# Patient Record
Sex: Female | Born: 1995 | Race: White | Hispanic: No | Smoking: Current every day smoker
Health system: Southern US, Community
[De-identification: ages and names within clinical notes are randomized; demographics above are authoritative.]

## PROBLEM LIST (undated history)

## (undated) DIAGNOSIS — T7840XA Allergy, unspecified, initial encounter: Secondary | ICD-10-CM

## (undated) DIAGNOSIS — Z720 Tobacco use: Secondary | ICD-10-CM

## (undated) HISTORY — PX: INDUCED ABORTION: SHX677

## (undated) HISTORY — DX: Allergy, unspecified, initial encounter: T78.40XA

## (undated) HISTORY — DX: Tobacco use: Z72.0

---
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
## 2016-01-05 ENCOUNTER — Ambulatory Visit
Admission: RE | Admit: 2016-01-05 | Discharge: 2016-01-05 | Disposition: A | Discharge status: Home / Self Care | Payer: BLUE CROSS/BLUE SHIELD | Source: Ambulatory Visit | Attending: Family Medicine | Admitting: Family Medicine

## 2016-01-05 DIAGNOSIS — R102 Pelvic and perineal pain: Secondary | ICD-10-CM | POA: Insufficient documentation

## 2016-01-05 DIAGNOSIS — Z30431 Encounter for routine checking of intrauterine contraceptive device: Secondary | ICD-10-CM | POA: Insufficient documentation

## 2016-02-04 ENCOUNTER — Other Ambulatory Visit: Payer: Self-pay | Admitting: Family Medicine

## 2016-02-04 DIAGNOSIS — R102 Pelvic and perineal pain: Secondary | ICD-10-CM

## 2016-02-04 MED ORDER — IBUPROFEN 800 MG PO TABS: 800.0000 mg | ORAL_TABLET | Freq: Three times a day (TID) | ORAL | Status: DC | PRN

## 2016-02-04 NOTE — Progress Notes (Signed)
Mother in office today for her own appointment requested refill for her daughter.

## 2016-03-09 ENCOUNTER — Ambulatory Visit (INDEPENDENT_AMBULATORY_CARE_PROVIDER_SITE_OTHER): Payer: BLUE CROSS/BLUE SHIELD | Admitting: Family Medicine

## 2016-03-09 ENCOUNTER — Encounter: Payer: Self-pay | Admitting: Family Medicine

## 2016-03-09 VITALS — BP 118/76 | HR 99 | Temp 98.1°F | Resp 17 | Ht 59.0 in | Wt 143.4 lb

## 2016-03-09 DIAGNOSIS — R059 Cough, unspecified: Secondary | ICD-10-CM

## 2016-03-09 DIAGNOSIS — J0100 Acute maxillary sinusitis, unspecified: Secondary | ICD-10-CM

## 2016-03-09 DIAGNOSIS — J302 Other seasonal allergic rhinitis: Secondary | ICD-10-CM | POA: Diagnosis not present

## 2016-03-09 DIAGNOSIS — R05 Cough: Secondary | ICD-10-CM | POA: Diagnosis not present

## 2016-03-09 MED ORDER — FEXOFENADINE HCL 180 MG PO TABS: 180.0000 mg | ORAL_TABLET | Freq: Every day | ORAL | Status: DC

## 2016-03-09 MED ORDER — AZITHROMYCIN 250 MG PO TABS: ORAL_TABLET | ORAL | Status: DC

## 2016-03-09 MED ORDER — BENZONATATE 200 MG PO CAPS: 200.0000 mg | ORAL_CAPSULE | Freq: Three times a day (TID) | ORAL | Status: DC | PRN

## 2016-03-09 NOTE — Progress Notes (Signed)
Name: Veronica Herman   MRN: [id number]    DOB: [birth year]   Date:03/09/2016       Progress Note  Subjective  Chief Complaint  Chief Complaint  Patient presents with  . Acute Visit    Sore throat / cough x1 wk    HPI  Pt. Presents for evaluation of 7 day history of sore throat, dry cough and ears feeling clogged up. She is also requesting a separate medication for allergies, she was prescribed levocetirizine by previous PCP, which is not covered   Past Medical History  Diagnosis Date  . Allergy     Past Surgical History  Procedure Laterality Date  . Induced abortion      almost 2 yrs ago    Family History  Problem Relation Age of Onset  . Heart murmur Mother   . Hyperlipidemia Mother   . Hypertension Mother   . Allergic Disorder Father   . Hyperlipidemia Father   . Hypertension Father   . Drug abuse Maternal Aunt   . Hyperlipidemia Maternal Aunt   . Hypertension Maternal Aunt   . Mental illness Maternal Aunt   . Drug abuse Maternal Uncle   . Hypertension Paternal Uncle   . Arthritis Maternal Grandmother   . Asthma Maternal Grandmother   . Cancer Maternal Grandmother   . Depression Maternal Grandmother   . Diabetes Maternal Grandmother   . Hyperlipidemia Maternal Grandmother   . Hypertension Maternal Grandmother   . Mental illness Maternal Grandmother   . Alcohol abuse Maternal Grandfather   . Arthritis Maternal Grandfather   . Asthma Maternal Grandfather   . Cancer Maternal Grandfather   . Diabetes Maternal Grandfather   . Hypertension Maternal Grandfather   . Arthritis Paternal Grandmother   . Asthma Paternal Grandmother   . Cancer Paternal Grandmother   . Diabetes Paternal Grandmother   . Hyperlipidemia Paternal Grandmother   . Hypertension Paternal Grandmother   . Alcohol abuse Paternal Grandfather   . Arthritis Paternal Grandfather   . Asthma Paternal Grandfather   . Cancer Paternal Grandfather   . Diabetes Paternal Grandfather   . Hypertension  Paternal Grandfather     Social History   Social History  . Marital Status: Single    Spouse Name: N/A  . Number of Children: N/A  . Years of Education: N/A   Occupational History  . Not on file.   Social History Main Topics  . Smoking status: Never Smoker   . Smokeless tobacco: Not on file  . Alcohol Use: No  . Drug Use: No  . Sexual Activity:    Partners: Male    Birth Control/ Protection: Condom, IUD   Other Topics Concern  . Not on file   Social History Narrative     Current outpatient prescriptions:  .  ibuprofen (ADVIL,MOTRIN) 800 MG tablet, Take 1 tablet (800 mg total) by mouth every 8 (eight) hours as needed., Disp: 40 tablet, Rfl: 3  No Known Allergies   Review of Systems  Constitutional: Positive for fever and chills.  HENT: Positive for sore throat.   Respiratory: Positive for cough. Negative for sputum production.     Objective  Filed Vitals:   03/09/16 0826  BP: 118/76  Pulse: 99  Temp: 98.1 F (36.7 C)  TempSrc: Oral  Resp: 17  Height:  (1.499 m)  Weight: 143 lb 6.4 oz (65.046 kg)  SpO2: 96%    Physical Exam  Constitutional: She is well-developed, well-nourished, and in no distress.  HENT:  Head: Normocephalic and atraumatic.  Right Ear: Tympanic membrane and ear canal normal.  Nose: Right sinus exhibits maxillary sinus tenderness. Left sinus exhibits maxillary sinus tenderness.  Mouth/Throat: Posterior oropharyngeal erythema present.  L ear unable to visualize TM 2/2 narrow canal.  Cardiovascular: Normal rate and regular rhythm.   Pulmonary/Chest: Breath sounds normal.  Nursing note and vitals reviewed.     Assessment & Plan  1. Acute maxillary sinusitis, recurrence not specified  - azithromycin (ZITHROMAX) 250 MG tablet; 2 tabs po day 1, then 1 tab po q day x 4 days.  Dispense: 6 tablet; Refill: 0  2. Cough Likely from sinusitis versus viral URI, will start on antitussive therapy. - benzonatate (TESSALON) 200 MG  capsule; Take 1 capsule (200 mg total) by mouth 3 (three) times daily as needed for cough.  Dispense: 20 capsule; Refill: 0  3. Seasonal allergies  - fexofenadine (ALLEGRA) 180 MG tablet; Take 1 tablet (180 mg total) by mouth daily.  Dispense: 30 tablet; Refill: 0   Yatzary Merriweather Asad A. Faylene KurtzShah [facility] 03/09/2016 8:33 AM

## 2016-03-19 ENCOUNTER — Ambulatory Visit: Payer: BLUE CROSS/BLUE SHIELD | Admitting: Internal Medicine

## 2016-07-17 IMAGING — US US PELVIS COMPLETE
1 series · 14 of 25 positions shown · non-contrast
Comparison: None

CLINICAL DATA: Pelvic pain and IUD check



[Series 1: us pelvis complete · 0.25mm/px · 14 of 90 slices shown]
[im 1/90]
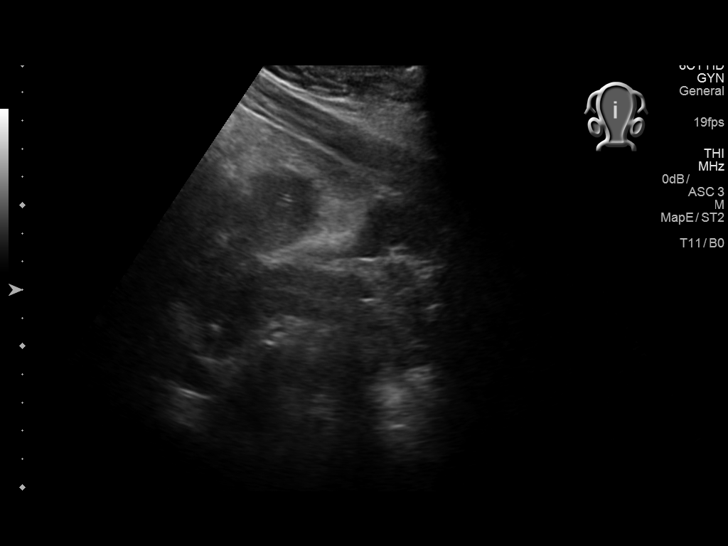
[im 8/90]
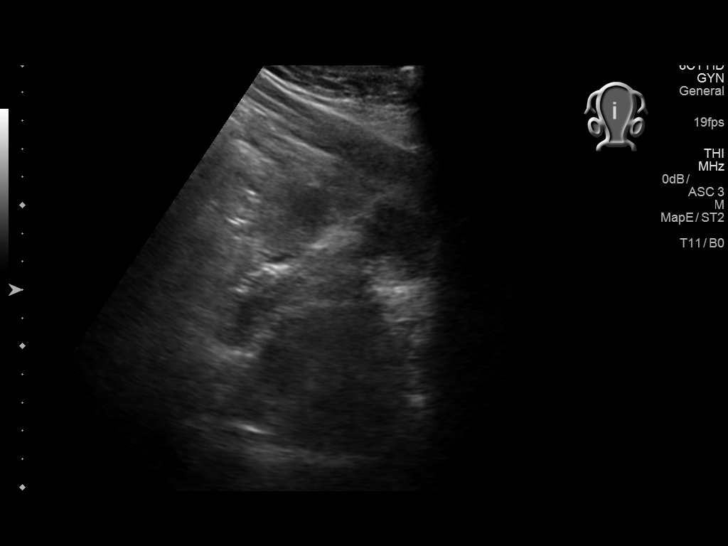
[im 15/90]
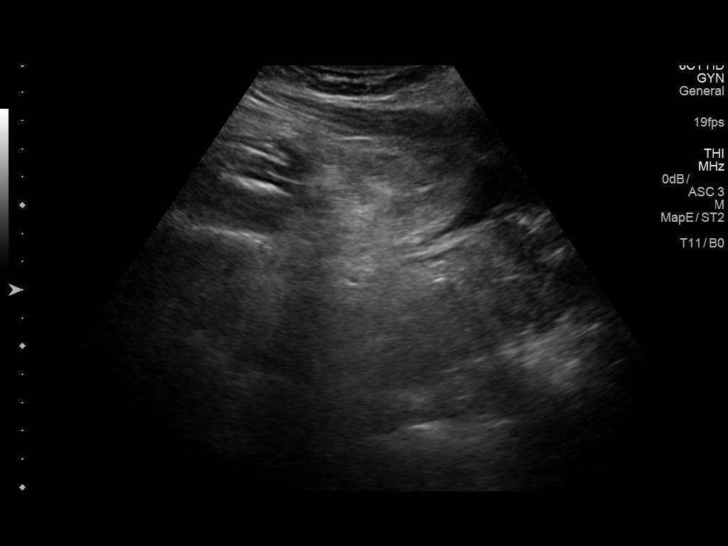
[im 23/90]
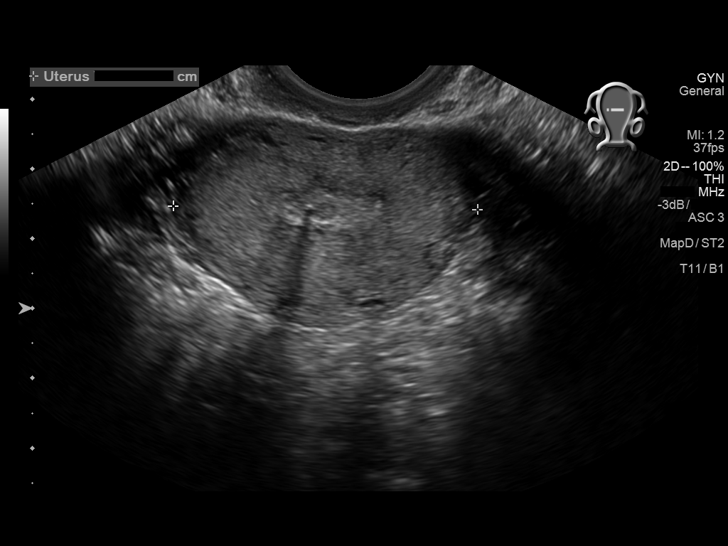
[im 30/90]
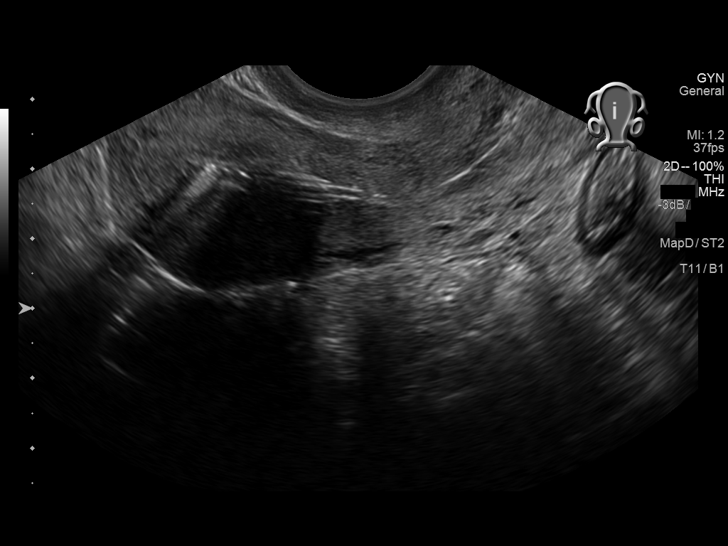
[im 34/90]
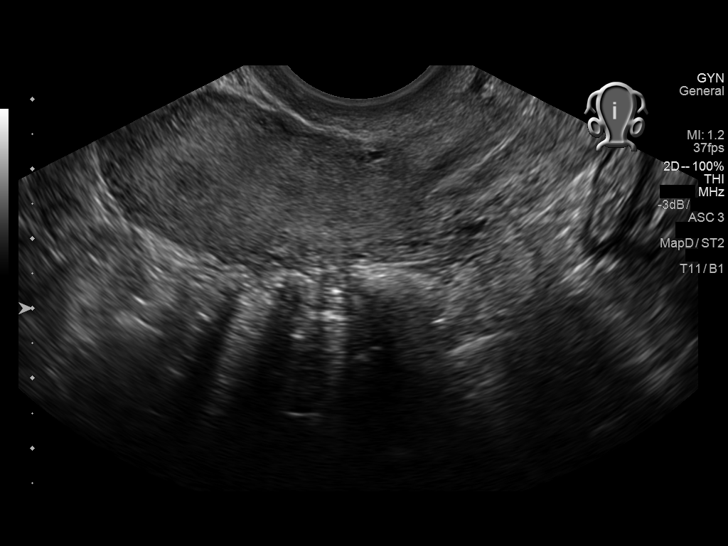
[im 41/90]
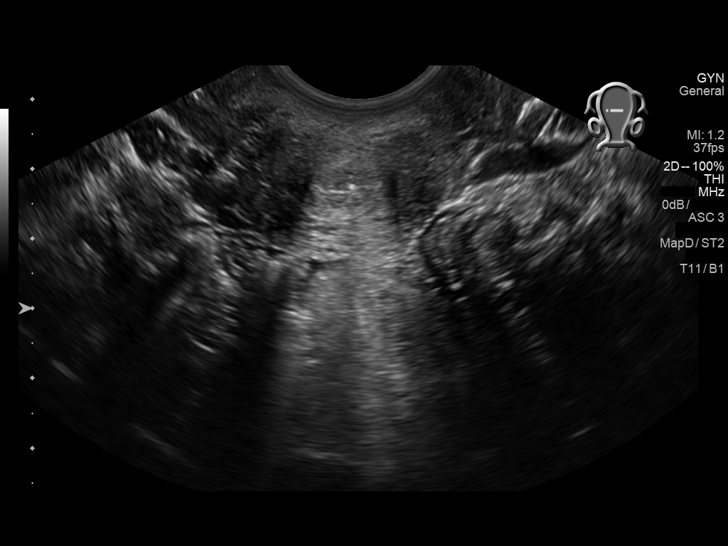
[im 49/90]
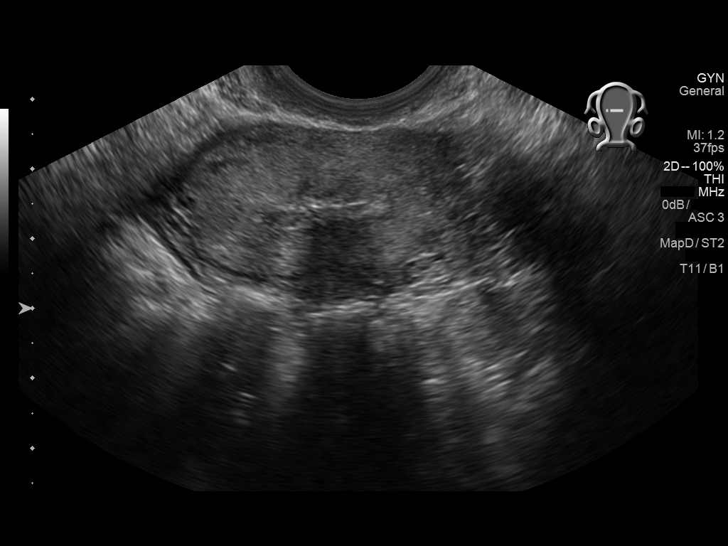
[im 56/90]
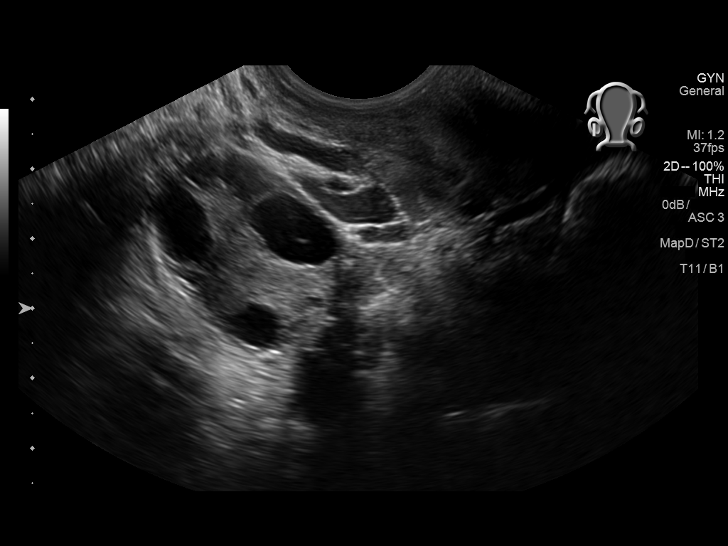
[im 60/90]
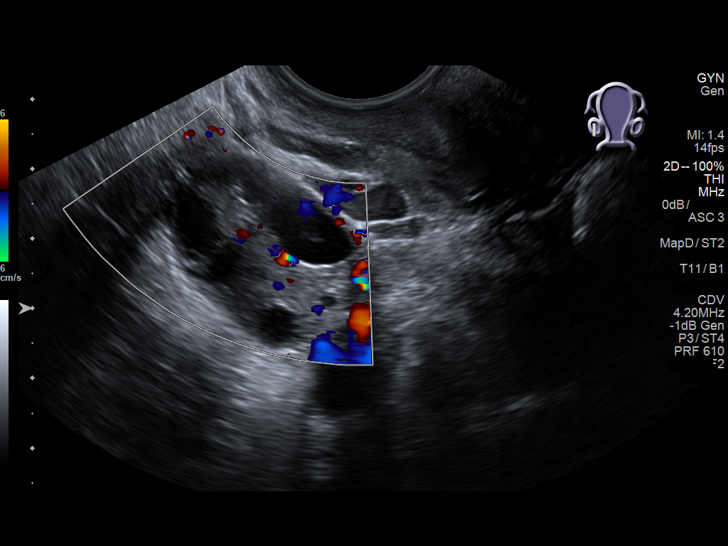
[im 67/90]
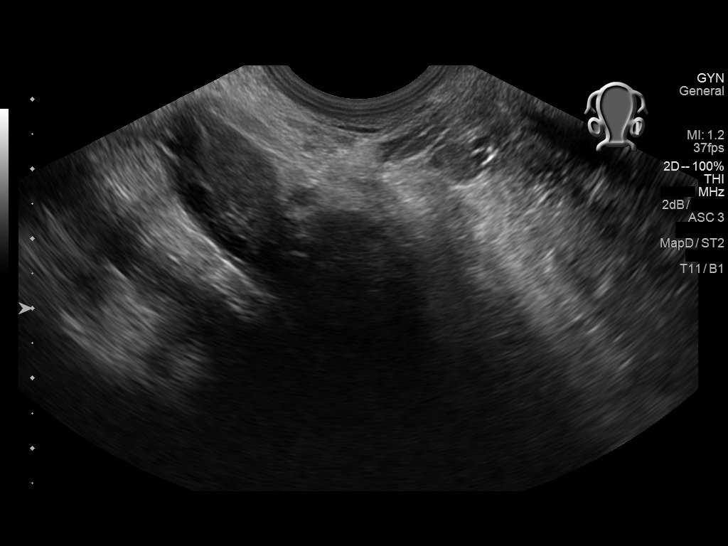
[im 75/90]
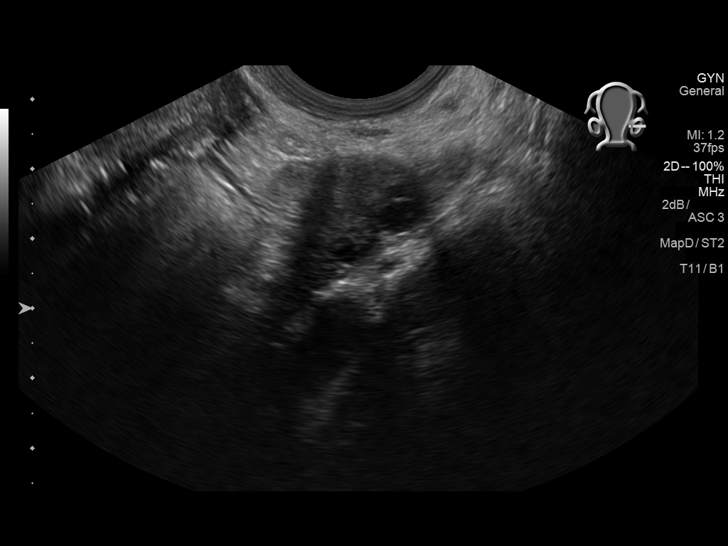
[im 82/90]
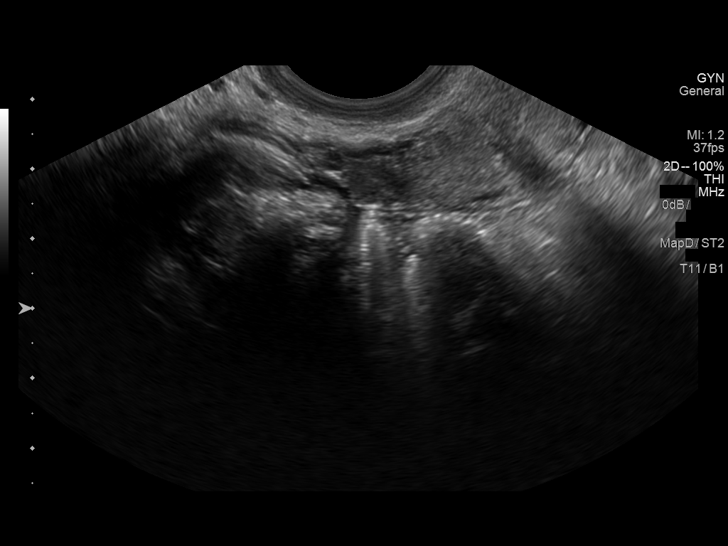
[im 90/90]
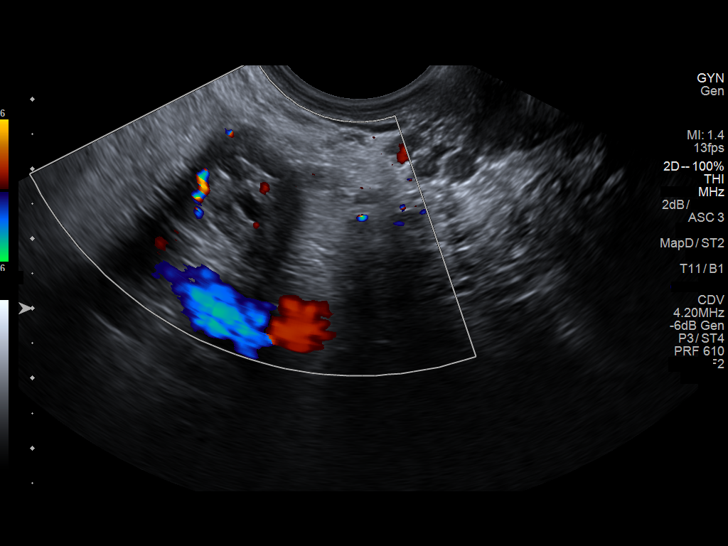

[14 of 25 positions shown; findings below may reference images not displayed]

FINDINGS: Uterus

Measurements: 6.6 x 2.7 x 4.4 cm.. No fibroids or other mass
visualized.

Endometrium

Thickness: 4.9 mm. An IUD is noted in place.. No focal abnormality
visualized.

Right ovary

Measurements: 3.1 x 2.3 x 2.3 cm.. Normal appearance/no adnexal
mass.

Left ovary

Measurements: 3.0 x 1.5 x 2.5 cm.. Normal appearance/no adnexal
mass.

Other findings

No abnormal free fluid.
IMPRESSION: IUD in place.  No acute abnormality noted.

## 2016-07-30 ENCOUNTER — Encounter: Payer: Self-pay | Admitting: Family Medicine

## 2016-07-30 ENCOUNTER — Ambulatory Visit (INDEPENDENT_AMBULATORY_CARE_PROVIDER_SITE_OTHER): Payer: BLUE CROSS/BLUE SHIELD | Admitting: Family Medicine

## 2016-07-30 DIAGNOSIS — F509 Eating disorder, unspecified: Secondary | ICD-10-CM | POA: Diagnosis not present

## 2016-07-30 DIAGNOSIS — Z720 Tobacco use: Secondary | ICD-10-CM

## 2016-07-30 DIAGNOSIS — N898 Other specified noninflammatory disorders of vagina: Secondary | ICD-10-CM | POA: Insufficient documentation

## 2016-07-30 MED ORDER — METRONIDAZOLE 500 MG PO TABS: 500.0000 mg | ORAL_TABLET | Freq: Two times a day (BID) | ORAL | 0 refills | Status: AC

## 2016-07-30 NOTE — Assessment & Plan Note (Signed)
Most likely BV; will start metronidazole; sending off wet mount; encouraged condom use

## 2016-07-30 NOTE — Progress Notes (Signed)
BP 116/80   Pulse 94   Temp 98.5 F (36.9 C) (Oral)   Resp 16   Wt 136 lb (61.7 kg)   LMP 07/14/2016   SpO2 94%   BMI 27.47 kg/m    Subjective:    Patient ID: Veronica Herman, female    DOB: 03-03-1996, 20 y.o.   MRN: [id number]  HPI: Veronica Herman is a 20 y.o. female  Chief Complaint  Patient presents with  . Vaginal Discharge    with odor and itching   She has some vaginal discharge; started after LMP finished; itching down below She has one yeast infection at age 20 Get cramps down below; normally come when she's about to start her period; cramps just last a few minutes each Fishy odor down below; a little swelling down there the whole thing No chance of pregnancy Sexually active, has IUD in place; she could not feel it and went to the GYN and they did US and it was there; Feb 2017 US reviewed; IUD in place; placed about a year ago No new partners; monogamous; not using condoms No burning with urination No fevers  Mother concerned she might have an eating disorder; she used to be more worried about her weight; she was on depo and got off of that and got IUD and thinks it came from that; she is not restricting calories; sometimes she eats just once a day, sometimes not eating at all; feels she is hungry; does drink more calories in fluids than eating; has always loved food, but this past year has been "weird"; not depressed; active but not exercising to lose weight; no laxatives; no diuretics  Dep[facility] screen [facility] 2/9 07/30/2016 03/09/2016 12/24/2015 10/07/2015  Decreased Interest 0 0 0 0  Down, Depressed, Hopeless 0 0 0 0  PHQ - 2 Score 0 0 0 0   Relevant past medical, surgical, social history reviewed Past Medical History:  Diagnosis Date  . Allergy    pollen  . Tobacco abuse 08/01/2016   Past Surgical History:  Procedure Laterality Date  . INDUCED ABORTION     almost 2 yrs ago  . INTRAUTERINE DEVICE INSERTION  2016   [facility] [place]   Social  History  Substance Use Topics  . Smoking status: Current Every Day Smoker    Packs/day: 0.25    Types: Cigarettes  . Smokeless tobacco: Never Used  . Alcohol use No   Interim medical history since last visit reviewed. Allergies and medications reviewed  Review of Systems Per HPI unless specifically indicated above     Objective:    BP 116/80   Pulse 94   Temp 98.5 F (36.9 C) (Oral)   Resp 16   Wt 136 lb (61.7 kg)   LMP 07/14/2016   SpO2 94%   BMI 27.47 kg/m   Wt Readings from Last 3 Encounters:  07/30/16 136 lb (61.7 kg) (65 %, Z= 0.38)*  03/09/16 143 lb 6.4 oz (65 kg) (76 %, Z= 0.70)*  12/24/15 141 lb (64 kg) (74 %, Z= 0.64)*   * Growth percentiles are based on CDC 2-20 Years data.    Physical Exam  Constitutional: She appears well-developed and well-nourished. No distress.  Cardiovascular: Normal rate.   Pulmonary/Chest: Effort normal.  Abdominal: Soft. She exhibits no distension.  Genitourinary: Uterus is not tender. Cervix exhibits no motion tenderness, no discharge and no friability. Right adnexum displays no mass, no tenderness and no fullness. Left adnexum displays no mass,  no tenderness and no fullness. No erythema, tenderness or bleeding in the vagina. Vaginal discharge (thin watery with fishy odor) found.  Psychiatric: She has a normal mood and affect. Her behavior is normal.      Assessment & Plan:   Problem List Items Addressed This Visit      Other   Vaginal discharge    Most likely BV; will start metronidazole; sending off wet mount; encouraged condom use      Relevant Orders   Wet prep, genital (Completed)   Tobacco abuse    Stressed to patient the importance of quitting cigarettes now      Atypical eating disorder    Recommended patient be evaluated by a counselor who specializes in eating disorders; reviewed list of local counselors with her and printed off names/numbers for her to call and schedule her own appt       Other Visit  Diagnoses   None.     Follow up plan: No Follow-up on file.  An after-visit summary was printed and given to the patient at check-out.  Please see the patient instructions which may contain other information and recommendations beyond what is mentioned above in the assessment and plan.  Meds ordered this encounter  Medications  . metroNIDAZOLE (FLAGYL) 500 MG tablet    Sig: Take 1 tablet (500 mg total) by mouth 2 (two) times daily. Do not drink alcohol while taking this medicine    Dispense:  21 tablet    Refill:  0   Orders Placed This Encounter  Procedures  . Wet prep, genital  . WET PREP BY MOLECULAR PROBE

## 2016-07-30 NOTE — Patient Instructions (Addendum)
I do encourage you to quit smoking Call [phone] to sign up for smoking cessation classes You can call 1-800-QUIT-NOW to talk with a smoking cessation coach  Start the new medicine for probable bacterial vaginosis  Contact a counselor and let me know if anything else I can do here  Smoking Cessation, Tips for Success If you are ready to quit smoking, congratulations! You have chosen to help yourself be healthier. Cigarettes bring nicotine, tar, carbon monoxide, and other irritants into your body. Your lungs, heart, and blood vessels will be able to work better without these poisons. There are many different ways to quit smoking. Nicotine gum, nicotine patches, a nicotine inhaler, or nicotine nasal spray can help with physical craving. Hypnosis, support groups, and medicines help break the habit of smoking. WHAT THINGS CAN I DO TO MAKE QUITTING EASIER?  Here are some tips to help you quit for good:  Pick a date when you will quit smoking completely. Tell all of your friends and family about your plan to quit on that date.  Do not try to slowly cut down on the number of cigarettes you are smoking. Pick a quit date and quit smoking completely starting on that day.  Throw away all cigarettes.   Clean and remove all ashtrays from your home, work, and car.  On a card, write down your reasons for quitting. Carry the card with you and read it when you get the urge to smoke.  Cleanse your body of nicotine. Drink enough water and fluids to keep your urine clear or pale yellow. Do this after quitting to flush the nicotine from your body.  Learn to predict your moods. Do not let a bad situation be your excuse to have a cigarette. Some situations in your life might tempt you into wanting a cigarette.  Never have "just one" cigarette. It leads to wanting another and another. Remind yourself of your decision to quit.  Change habits associated with smoking. If you smoked while driving or when  feeling stressed, try other activities to replace smoking. Stand up when drinking your coffee. Brush your teeth after eating. Sit in a different chair when you read the paper. Avoid alcohol while trying to quit, and try to drink fewer caffeinated beverages. Alcohol and caffeine may urge you to smoke.  Avoid foods and drinks that can trigger a desire to smoke, such as sugary or spicy foods and alcohol.  Ask people who smoke not to smoke around you.  Have something planned to do right after eating or having a cup of coffee. For example, plan to take a walk or exercise.  Try a relaxation exercise to calm you down and decrease your stress. Remember, you may be tense and nervous for the first 2 weeks after you quit, but this will pass.  Find new activities to keep your hands busy. Play with a pen, coin, or rubber band. Doodle or draw things on paper.  Brush your teeth right after eating. This will help cut down on the craving for the taste of tobacco after meals. You can also try mouthwash.   Use oral substitutes in place of cigarettes. Try using lemon drops, carrots, cinnamon sticks, or chewing gum. Keep them handy so they are available when you have the urge to smoke.  When you have the urge to smoke, try deep breathing.  Designate your home as a nonsmoking area.  If you are a heavy smoker, ask your health care provider about a prescription for  nicotine chewing gum. It can ease your withdrawal from nicotine.  Reward yourself. Set aside the cigarette money you save and buy yourself something nice.  Look for support from others. Join a support group or smoking cessation program. Ask someone at home or at work to help you with your plan to quit smoking.  Always ask yourself, "Do I need this cigarette or is this just a reflex?" Tell yourself, "Today, I choose not to smoke," or "I do not want to smoke." You are reminding yourself of your decision to quit.  Do not replace cigarette smoking with  electronic cigarettes (commonly called e-cigarettes). The safety of e-cigarettes is unknown, and some may contain harmful chemicals.  If you relapse, do not give up! Plan ahead and think about what you will do the next time you get the urge to smoke. HOW WILL I FEEL WHEN I QUIT SMOKING? You may have symptoms of withdrawal because your body is used to nicotine (the addictive substance in cigarettes). You may crave cigarettes, be irritable, feel very hungry, cough often, get headaches, or have difficulty concentrating. The withdrawal symptoms are only temporary. They are strongest when you first quit but will go away within 10-14 days. When withdrawal symptoms occur, stay in control. Think about your reasons for quitting. Remind yourself that these are signs that your body is healing and getting used to being without cigarettes. Remember that withdrawal symptoms are easier to treat than the major diseases that smoking can cause.  Even after the withdrawal is over, expect periodic urges to smoke. However, these cravings are generally short lived and will go away whether you smoke or not. Do not smoke! WHAT RESOURCES ARE AVAILABLE TO HELP ME QUIT SMOKING? Your health care provider can direct you to community resources or hospitals for support, which may include:  Group support.  Education.  Hypnosis.  Therapy.   This information is not intended to replace advice given to you by your health care provider. Make sure you discuss any questions you have with your health care provider.   Document Released: 08/06/2004 Document Revised: 11/29/2014 Document Reviewed: 04/26/2013 Elsevier Interactive Patient Education [employer]

## 2016-07-31 LAB — WET PREP BY MOLECULAR PROBE
Candida species: NEGATIVE
Gardnerella vaginalis: POSITIVE — AB
TRICHOMONAS VAG: NEGATIVE

## 2016-08-01 ENCOUNTER — Encounter: Payer: Self-pay | Admitting: Family Medicine

## 2016-08-01 DIAGNOSIS — Z720 Tobacco use: Secondary | ICD-10-CM

## 2016-08-01 DIAGNOSIS — F509 Eating disorder, unspecified: Secondary | ICD-10-CM | POA: Insufficient documentation

## 2016-08-01 HISTORY — DX: Tobacco use: Z72.0

## 2016-08-01 NOTE — Assessment & Plan Note (Signed)
Recommended patient be evaluated by a counselor who specializes in eating disorders; reviewed list of local counselors with her and printed off names/numbers for her to call and schedule her own appt

## 2016-08-01 NOTE — Assessment & Plan Note (Signed)
Stressed to patient the importance of quitting cigarettes now

## 2016-08-02 LAB — WET PREP, GENITAL

## 2016-09-28 DIAGNOSIS — R220 Localized swelling, mass and lump, head: Secondary | ICD-10-CM | POA: Diagnosis not present

## 2016-09-28 DIAGNOSIS — R52 Pain, unspecified: Secondary | ICD-10-CM | POA: Diagnosis not present

## 2016-09-28 DIAGNOSIS — K011 Impacted teeth: Secondary | ICD-10-CM | POA: Diagnosis not present

## 2017-01-10 ENCOUNTER — Ambulatory Visit: Payer: BLUE CROSS/BLUE SHIELD | Admitting: Family Medicine

## 2017-01-14 ENCOUNTER — Ambulatory Visit: Payer: BLUE CROSS/BLUE SHIELD | Admitting: Family Medicine

## 2017-05-09 ENCOUNTER — Ambulatory Visit (INDEPENDENT_AMBULATORY_CARE_PROVIDER_SITE_OTHER): Payer: BLUE CROSS/BLUE SHIELD | Admitting: Advanced Practice Midwife

## 2017-05-09 ENCOUNTER — Telehealth: Payer: Self-pay | Admitting: Advanced Practice Midwife

## 2017-05-09 ENCOUNTER — Encounter: Payer: Self-pay | Admitting: Advanced Practice Midwife

## 2017-05-09 VITALS — BP 110/60 | HR 77 | Ht 59.0 in | Wt 136.0 lb

## 2017-05-09 DIAGNOSIS — Z113 Encounter for screening for infections with a predominantly sexual mode of transmission: Secondary | ICD-10-CM | POA: Diagnosis not present

## 2017-05-09 DIAGNOSIS — Z01419 Encounter for gynecological examination (general) (routine) without abnormal findings: Secondary | ICD-10-CM

## 2017-05-09 NOTE — Telephone Encounter (Signed)
Pt is scheduled for skyla removal and re-insertion on 05/20/17. Thanks TNP

## 2017-05-09 NOTE — Patient Instructions (Signed)
Steps to Quit Smoking Smoking tobacco can be harmful to your health and can affect almost every organ in your body. Smoking puts you, and those around you, at risk for developing many serious chronic diseases. Quitting smoking is difficult, but it is one of the best things that you can do for your health. It is never too late to quit. What are the benefits of quitting smoking? When you quit smoking, you lower your risk of developing serious diseases and conditions, such as:  Lung cancer or lung disease, such as COPD.  Heart disease.  Stroke.  Heart attack.  Infertility.  Osteoporosis and bone fractures.  Additionally, symptoms such as coughing, wheezing, and shortness of breath may get better when you quit. You may also find that you get sick less often because your body is stronger at fighting off colds and infections. If you are pregnant, quitting smoking can help to reduce your chances of having a baby of low birth weight. How do I get ready to quit? When you decide to quit smoking, create a plan to make sure that you are successful. Before you quit:  Pick a date to quit. Set a date within the next two weeks to give you time to prepare.  Write down the reasons why you are quitting. Keep this list in places where you will see it often, such as on your bathroom mirror or in your car or wallet.  Identify the people, places, things, and activities that make you want to smoke (triggers) and avoid them. Make sure to take these actions: ? Throw away all cigarettes at home, at work, and in your car. ? Throw away smoking accessories, such as ashtrays and lighters. ? Clean your car and make sure to empty the ashtray. ? Clean your home, including curtains and carpets.  Tell your family, friends, and coworkers that you are quitting. Support from your loved ones can make quitting easier.  Talk with your health care provider about your options for quitting smoking.  Find out what treatment  options are covered by your health insurance.  What strategies can I use to quit smoking? Talk with your healthcare provider about different strategies to quit smoking. Some strategies include:  Quitting smoking altogether instead of gradually lessening how much you smoke over a period of time. Research shows that quitting "cold turkey" is more successful than gradually quitting.  Attending in-person counseling to help you build problem-solving skills. You are more likely to have success in quitting if you attend several counseling sessions. Even short sessions of 10 minutes can be effective.  Finding resources and support systems that can help you to quit smoking and remain smoke-free after you quit. These resources are most helpful when you use them often. They can include: ? Online chats with a counselor. ? Telephone quitlines. ? Printed self-help materials. ? Support groups or group counseling. ? Text messaging programs. ? Mobile phone applications.  Taking medicines to help you quit smoking. (If you are pregnant or breastfeeding, talk with your health care provider first.) Some medicines contain nicotine and some do not. Both types of medicines help with cravings, but the medicines that include nicotine help to relieve withdrawal symptoms. Your health care provider may recommend: ? Nicotine patches, gum, or lozenges. ? Nicotine inhalers or sprays. ? Non-nicotine medicine that is taken by mouth.  Talk with your health care provider about combining strategies, such as taking medicines while you are also receiving in-person counseling. Using these two strategies together   makes you more likely to succeed in quitting than if you used either strategy on its own. If you are pregnant or breastfeeding, talk with your health care provider about finding counseling or other support strategies to quit smoking. Do not take medicine to help you quit smoking unless told to do so by your health care  provider. What things can I do to make it easier to quit? Quitting smoking might feel overwhelming at first, but there is a lot that you can do to make it easier. Take these important actions:  Reach out to your family and friends and ask that they support and encourage you during this time. Call telephone quitlines, reach out to support groups, or work with a counselor for support.  Ask people who smoke to avoid smoking around you.  Avoid places that trigger you to smoke, such as bars, parties, or smoke-break areas at work.  Spend time around people who do not smoke.  Lessen stress in your life, because stress can be a smoking trigger for some people. To lessen stress, try: ? Exercising regularly. ? Deep-breathing exercises. ? Yoga. ? Meditating. ? Performing a body scan. This involves closing your eyes, scanning your body from head to toe, and noticing which parts of your body are particularly tense. Purposefully relax the muscles in those areas.  Download or purchase mobile phone or tablet apps (applications) that can help you stick to your quit plan by providing reminders, tips, and encouragement. There are many free apps, such as QuitGuide from the CDC (Centers for Disease Control and Prevention). You can find other support for quitting smoking (smoking cessation) through smokefree.gov and other websites.  How will I feel when I quit smoking? Within the first 24 hours of quitting smoking, you may start to feel some withdrawal symptoms. These symptoms are usually most noticeable 2-3 days after quitting, but they usually do not last beyond 2-3 weeks. Changes or symptoms that you might experience include:  Mood swings.  Restlessness, anxiety, or irritation.  Difficulty concentrating.  Dizziness.  Strong cravings for sugary foods in addition to nicotine.  Mild weight gain.  Constipation.  Nausea.  Coughing or a sore throat.  Changes in how your medicines work in your  body.  A depressed mood.  Difficulty sleeping (insomnia).  After the first 2-3 weeks of quitting, you may start to notice more positive results, such as:  Improved sense of smell and taste.  Decreased coughing and sore throat.  Slower heart rate.  Lower blood pressure.  Clearer skin.  The ability to breathe more easily.  Fewer sick days.  Quitting smoking is very challenging for most people. Do not get discouraged if you are not successful the first time. Some people need to make many attempts to quit before they achieve long-term success. Do your best to stick to your quit plan, and talk with your health care provider if you have any questions or concerns. This information is not intended to replace advice given to you by your health care provider. Make sure you discuss any questions you have with your health care provider. Document Released: 11/02/2001 Document Revised: 07/06/2016 Document Reviewed: 03/25/2015 Elsevier Interactive Patient Education  2017 Elsevier Inc.  
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
## 2017-05-10 NOTE — Telephone Encounter (Signed)
Verifying w/JEG Christean GriefSkyla or [employer]yleena

## 2017-05-11 LAB — GC/CHLAMYDIA PROBE AMP
CHLAMYDIA, DNA PROBE: NEGATIVE
Neisseria gonorrhoeae by PCR: NEGATIVE

## 2017-05-11 NOTE — Telephone Encounter (Signed)
Per JEG. [name] is appropriate for this patient. Will order to arrive by apt date/time.

## 2017-05-12 NOTE — Telephone Encounter (Signed)
Kyleena stock reserved for this patient. 

## 2017-05-20 ENCOUNTER — Ambulatory Visit (INDEPENDENT_AMBULATORY_CARE_PROVIDER_SITE_OTHER): Payer: BLUE CROSS/BLUE SHIELD | Admitting: Advanced Practice Midwife

## 2017-05-20 ENCOUNTER — Encounter: Payer: Self-pay | Admitting: Advanced Practice Midwife

## 2017-05-20 VITALS — BP 120/80 | HR 70 | Ht 59.0 in | Wt 136.0 lb

## 2017-05-20 DIAGNOSIS — Z30433 Encounter for removal and reinsertion of intrauterine contraceptive device: Secondary | ICD-10-CM | POA: Diagnosis not present

## 2017-05-20 NOTE — Progress Notes (Signed)
        GYNECOLOGY OFFICE PROCEDURE NOTE  Veronica Herman is a 21 y.o. G1P0010 here for IUD removal and reinsertion. The patient currently has a Skyla IUD placed 3 years ago, which will be replaced with a Kyleena IUD today.  No GYN concerns.  Last STD screen was on 6/18 and was normal.  IUD Removal and Reinsertion  Patient identified, informed consent performed, consent signed.   Discussed risks of irregular bleeding, cramping, infection, malpositioning or uterine perforation of the IUD which may require further procedures. Time out was performed. Speculum placed in the vagina. The strings of the IUD were grasped and pulled using ring forceps. The IUD was successfully removed in its entirety. The cervix was cleaned with Betadine x 2 and grasped anteriorly with a single tooth tenaculum.  The uterus was sounded to IUD insertion apparatus was used to sound the uterus to 7 cm using a uterine sound.  The IUD was then placed per manufacturer's recommendations. Strings trimmed to 3 cm. Tenaculum was removed, good hemostasis noted. Patient tolerated procedure well.   Patient was given post-procedure instructions.  Patient was also asked to check IUD strings periodically and follow up in 6 weeks for IUD check.   Veronica MallJane Harden Herman, CNM [facility], [facility]

## 2017-05-20 NOTE — Patient Instructions (Signed)

## 2017-05-22 ENCOUNTER — Encounter: Payer: Self-pay | Admitting: Emergency Medicine

## 2017-05-22 ENCOUNTER — Emergency Department: Payer: BLUE CROSS/BLUE SHIELD

## 2017-05-22 ENCOUNTER — Emergency Department
Admission: EM | Admit: 2017-05-22 | Discharge: 2017-05-22 | Disposition: A | Discharge status: Home / Self Care | Payer: BLUE CROSS/BLUE SHIELD | Attending: Emergency Medicine | Admitting: Emergency Medicine

## 2017-05-22 DIAGNOSIS — S39012A Strain of muscle, fascia and tendon of lower back, initial encounter: Secondary | ICD-10-CM | POA: Insufficient documentation

## 2017-05-22 DIAGNOSIS — Y939 Activity, unspecified: Secondary | ICD-10-CM | POA: Diagnosis not present

## 2017-05-22 DIAGNOSIS — Y92410 Unspecified street and highway as the place of occurrence of the external cause: Secondary | ICD-10-CM | POA: Diagnosis not present

## 2017-05-22 DIAGNOSIS — F17210 Nicotine dependence, cigarettes, uncomplicated: Secondary | ICD-10-CM | POA: Insufficient documentation

## 2017-05-22 DIAGNOSIS — Y999 Unspecified external cause status: Secondary | ICD-10-CM | POA: Diagnosis not present

## 2017-05-22 DIAGNOSIS — M545 Low back pain: Secondary | ICD-10-CM | POA: Diagnosis not present

## 2017-05-22 DIAGNOSIS — M5432 Sciatica, left side: Secondary | ICD-10-CM | POA: Diagnosis not present

## 2017-05-22 LAB — POCT PREGNANCY, URINE: Preg Test, Ur: NEGATIVE

## 2017-05-22 MED ORDER — CYCLOBENZAPRINE HCL 5 MG PO TABS: 5.0000 mg | ORAL_TABLET | Freq: Three times a day (TID) | ORAL | 0 refills | Status: DC | PRN

## 2017-05-22 MED ORDER — KETOROLAC TROMETHAMINE 10 MG PO TABS: 10.0000 mg | ORAL_TABLET | Freq: Four times a day (QID) | ORAL | 0 refills | Status: AC | PRN

## 2017-05-22 MED ORDER — CYCLOBENZAPRINE HCL 10 MG PO TABS
5.0000 mg | ORAL_TABLET | Freq: Once | ORAL | Status: AC
  Administered 2017-05-22: 5 mg via ORAL
  Filled 2017-05-22: qty 1

## 2017-05-22 MED ORDER — KETOROLAC TROMETHAMINE 30 MG/ML IJ SOLN
30.0000 mg | Freq: Once | INTRAMUSCULAR | Status: AC
  Administered 2017-05-22: 30 mg via INTRAMUSCULAR
  Filled 2017-05-22: qty 1
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
## 2017-06-17 ENCOUNTER — Ambulatory Visit: Payer: BLUE CROSS/BLUE SHIELD | Admitting: Advanced Practice Midwife

## 2017-11-03 ENCOUNTER — Encounter: Payer: Self-pay | Admitting: Advanced Practice Midwife

## 2017-11-03 ENCOUNTER — Ambulatory Visit (INDEPENDENT_AMBULATORY_CARE_PROVIDER_SITE_OTHER): Payer: BLUE CROSS/BLUE SHIELD | Admitting: Advanced Practice Midwife

## 2017-11-03 VITALS — BP 126/60 | HR 80 | Wt 138.0 lb

## 2017-11-03 DIAGNOSIS — Z30431 Encounter for routine checking of intrauterine contraceptive device: Secondary | ICD-10-CM

## 2017-11-03 NOTE — Progress Notes (Signed)
       IUD String Check  Subjctive: Ms. Veronica Herman presents for IUD string check.  She had a Skyla placed 6 months ago.  Since placement of her IUD she has had irregular vaginal bleeding.  She denies cramping or discomfort. She has had intercourse since placement.  She has not checked the strings.  She denies any fever, chills, nausea, vomiting, or other complaints.  She wants to make sure there are no problems with the IUD.  Objective: BP 126/60 (BP Location: Left Arm, Patient Position: Sitting, Cuff Size: Normal)   Pulse 80   Wt 138 lb (62.6 kg)   LMP 10/19/2017   BMI 27.87 kg/m  Gen:  NAD, A&Ox3 HEENT: normocephalic, anicteric Pulmonary: no increased work of breathing Pelvic: Normal appearing external female genitalia, normal vaginal epithelium, no abnormal discharge. Normal appearing cervix.  IUD strings visible and 3 cm in length similar to at the time of placement. Psychiatric: mood appropriate, affect full Neurologic: grossly normal   Assessment: 21 y.o. year old female status post prior Skyla IUD placement 6 months ago, doing well.  Plan: 1.  The patient was given instructions to check her IUD strings monthly and call with any problems or concerns.  She should call for fevers, chills, abnormal vaginal discharge, pelvic pain, or other complaints. 2.  She will return for a annual exam in 1 year.  All questions answered.  Veronica Herman, CNM  11/03/2017 11:01 AM

## 2018-02-17 ENCOUNTER — Ambulatory Visit: Payer: BLUE CROSS/BLUE SHIELD | Admitting: Advanced Practice Midwife

## 2018-03-17 ENCOUNTER — Ambulatory Visit: Payer: BLUE CROSS/BLUE SHIELD | Admitting: Family Medicine

## 2018-03-28 ENCOUNTER — Ambulatory Visit: Payer: BLUE CROSS/BLUE SHIELD | Admitting: Family Medicine

## 2018-04-27 ENCOUNTER — Other Ambulatory Visit: Payer: Self-pay

## 2018-04-27 ENCOUNTER — Encounter: Payer: Self-pay | Admitting: Emergency Medicine

## 2018-04-27 ENCOUNTER — Emergency Department
Admission: EM | Admit: 2018-04-27 | Discharge: 2018-04-27 | Disposition: A | Discharge status: Home / Self Care | Payer: BLUE CROSS/BLUE SHIELD | Attending: Emergency Medicine | Admitting: Emergency Medicine

## 2018-04-27 DIAGNOSIS — J209 Acute bronchitis, unspecified: Secondary | ICD-10-CM

## 2018-04-27 DIAGNOSIS — R0981 Nasal congestion: Secondary | ICD-10-CM | POA: Diagnosis not present

## 2018-04-27 DIAGNOSIS — F17210 Nicotine dependence, cigarettes, uncomplicated: Secondary | ICD-10-CM | POA: Insufficient documentation

## 2018-04-27 DIAGNOSIS — R05 Cough: Secondary | ICD-10-CM | POA: Diagnosis not present

## 2018-04-27 DIAGNOSIS — J029 Acute pharyngitis, unspecified: Secondary | ICD-10-CM | POA: Diagnosis not present

## 2018-04-27 LAB — GROUP A STREP BY PCR: GROUP A STREP BY PCR: NOT DETECTED

## 2018-04-27 MED ORDER — AZITHROMYCIN 250 MG PO TABS: ORAL_TABLET | ORAL | 0 refills | Status: AC

## 2018-04-27 MED ORDER — AZITHROMYCIN 500 MG PO TABS
500.0000 mg | ORAL_TABLET | Freq: Once | ORAL | Status: AC
  Administered 2018-04-27: 500 mg via ORAL
  Filled 2018-04-27: qty 1

## 2018-04-27 MED ORDER — BENZONATATE 100 MG PO CAPS
200.0000 mg | ORAL_CAPSULE | Freq: Once | ORAL | Status: AC
  Administered 2018-04-27: 200 mg via ORAL
  Filled 2018-04-27: qty 2

## 2018-04-27 NOTE — ED Triage Notes (Signed)
Patient ambulatory to triage with steady gait, without difficulty or distress noted; pt reports x 2 days having cold symptoms, fever & sore throat

## 2018-04-28 NOTE — ED Provider Notes (Signed)
[facility] Emergency Department Provider Note    First MD Initiated Contact with Patient 04/27/18 [id number]     (approximate)  I have reviewed the triage vital signs and the nursing notes.   HISTORY  Chief Complaint Sore Throat    HPI Veronica Herman is a 22 y.o. female resents to the emergency department with 2-day history of congestion sore throat and fever.  Patient also admits to nonproductive cough.  Patient does admit to half pack cigarette use daily   Past Medical History:  Diagnosis Date  . Allergy    pollen  . Tobacco abuse 08/01/2016    Patient Active Problem List   Diagnosis Date Noted  . 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 Tobacco abuse 08/01/2016  . Vaginal discharge 07/30/2016  . IUD check up 12/24/2015  . Screening for STD (sexually transmitted disease) 12/24/2015  . Seasonal allergies 10/07/2015    Past Surgical History:  Procedure Laterality Date  . INDUCED ABORTION     almost 2 yrs ago  . INTRAUTERINE DEVICE INSERTION  2016   health dept in [facility]    Prior to Admission medications   Medication Sig Start Date End Date Taking? Authorizing Provider  azithromycin (ZITHROMAX Z-PAK) 250 MG tablet Take 2 tablets (500 mg) on  Day 1,  followed by 1 tablet (250 mg) once daily on Days 2 through 5. 04/27/18 05/02/18  Darci Current, MD  cyclobenzaprine (FLEXERIL) 5 MG tablet Take 1 tablet (5 mg total) by mouth 3 (three) times daily as needed. 05/22/17   Little, Traci M, PA-C  ibuprofen (ADVIL,MOTRIN) 800 MG tablet Take 1 tablet (800 mg total) by mouth every 8 (eight) hours as needed. 02/04/16   Edwena Felty, MD  Levonorgestrel [facility]) 19.5 MG IUD by Intrauterine route.    [provider]    Allergies No known drug allergies  Family History  Problem Relation Age of Onset  . Heart murmur Mother   . Hyperlipidemia Mother   . Hypertension Mother   . Allergic Disorder Father   . Hyperlipidemia Father   .  Hypertension Father   . Drug abuse Maternal Aunt   . Hyperlipidemia Maternal Aunt   . Hypertension Maternal Aunt   . Mental illness Maternal Aunt   . Drug abuse Maternal Uncle   . Hypertension Paternal Uncle   . Arthritis Maternal Grandmother   . Asthma Maternal Grandmother   . Cancer Maternal Grandmother   . Depression Maternal Grandmother   . Diabetes Maternal Grandmother   . Hyperlipidemia Maternal Grandmother   . Hypertension Maternal Grandmother   . Mental illness Maternal Grandmother   . Alcohol abuse Maternal Grandfather   . Arthritis Maternal Grandfather   . Asthma Maternal Grandfather   . Cancer Maternal Grandfather   . Diabetes Maternal Grandfather   . Hypertension Maternal Grandfather   . Arthritis Paternal Grandmother   . Asthma Paternal Grandmother   . Cancer Paternal Grandmother   . Diabetes Paternal Grandmother   . Hyperlipidemia Paternal Grandmother   . Hypertension Paternal Grandmother   . Alcohol abuse Paternal Grandfather   . Arthritis Paternal Grandfather   . Asthma Paternal Grandfather   . Cancer Paternal Grandfather   . Diabetes Paternal Grandfather   . Hypertension Paternal Grandfather     Social History Social History   Tobacco Use  . Smoking status: Current Every Day Smoker    Packs/day: 0.25    Types: Cigarettes  . Smokeless tobacco: Never Used  Substance Use  Topics  . Alcohol use: No    Alcohol/week: 0.0 oz  . Drug use: No    Review of Systems Constitutional: No fever/chills Eyes: No visual changes. ENT: Positive for nasal congestion and sore throat. Cardiovascular: Denies chest pain. Respiratory: Positive for cough Gastrointestinal: No abdominal pain.  No nausea, no vomiting.  No diarrhea.  No constipation. Genitourinary: Negative for dysuria. Musculoskeletal: Negative for neck pain.  Negative for back pain. Integumentary: Negative for rash. Neurological: Negative for headaches, focal weakness or  numbness.  ____________________________________________   PHYSICAL EXAM:  VITAL SIGNS: ED Triage Vitals  Enc Vitals Group     BP 04/27/18 0459 124/70     Pulse Rate 04/27/18 0459 70     Resp 04/27/18 0459 20     Temp 04/27/18 0459 98.3 F (36.8 C)     Temp Source 04/27/18 0459 Oral     SpO2 04/27/18 0459 100 %     Weight 04/27/18 0443 61.2 kg (135 lb)     Height 04/27/18 0443 1.245 m (4\' 1" )     Head Circumference --      Peak Flow --      Pain Score 04/27/18 0443 4     Pain Loc --      Pain Edu? --      Excl. in GC? --     Constitutional: Alert and oriented. Well appearing and in no acute distress. Eyes: Conjunctivae are normal. Head: Atraumatic. Mouth/Throat: Mucous membranes are moist.  Pharyngeal erythema no exudates noted  neck: No stridor.   Cardiovascular: Normal rate, regular rhythm. Good peripheral circulation. Grossly normal heart sounds. Respiratory: Normal respiratory effort.  No retractions. Lungs CTAB. Gastrointestinal: Soft and nontender. No distention.  Musculoskeletal: No lower extremity tenderness nor edema. No gross deformities of extremities. Neurologic:  Normal speech and language. No gross focal neurologic deficits are appreciated.  Skin:  Skin is warm, dry and intact. No rash noted. Psychiatric: Mood and affect are normal. Speech and behavior are normal.  ____________________________________________   LABS (all labs ordered are listed, but only abnormal results are displayed)  Labs Reviewed  GROUP A STREP BY PCR     Procedures   ____________________________________________   INITIAL IMPRESSION / ASSESSMENT AND PLAN / ED COURSE  As part of my medical decision making, I reviewed the following data within the electronic MEDICAL RECORD NUMBER   22 year old female presented with above-stated history and physical exam with concern for possible URI pharyngitis sinusitis.  Patient given azithromycin in the emergency department will be prescribed  same for home rapid strep negative ____________________________________________  FINAL CLINICAL IMPRESSION(S) / ED DIAGNOSES  Final diagnoses:  Pharyngitis, unspecified etiology  Acute bronchitis, unspecified organism     MEDICATIONS GIVEN DURING THIS VISIT:  Medications  benzonatate (TESSALON) capsule 200 mg (200 mg Oral Given 04/27/18 0524)  azithromycin (ZITHROMAX) tablet 500 mg (500 mg Oral Given 04/27/18 0524)     ED Discharge Orders        Ordered    azithromycin (ZITHROMAX Z-PAK) 250 MG tablet     04/27/18 [id number]       Note:  This document was prepared using Dragon voice recognition software and may include unintentional dictation errors.    Darci CurrentBrown, [place] N, MD 04/28/18 [id number]

## 2018-07-13 ENCOUNTER — Encounter: Payer: Self-pay | Admitting: Family Medicine

## 2018-07-13 ENCOUNTER — Ambulatory Visit (INDEPENDENT_AMBULATORY_CARE_PROVIDER_SITE_OTHER): Payer: BLUE CROSS/BLUE SHIELD | Admitting: Family Medicine

## 2018-07-13 VITALS — BP 112/62 | HR 79 | Temp 98.2°F | Ht 59.0 in | Wt 132.2 lb

## 2018-07-13 DIAGNOSIS — J069 Acute upper respiratory infection, unspecified: Secondary | ICD-10-CM

## 2018-07-13 DIAGNOSIS — Z720 Tobacco use: Secondary | ICD-10-CM | POA: Diagnosis not present

## 2018-07-13 DIAGNOSIS — J029 Acute pharyngitis, unspecified: Secondary | ICD-10-CM | POA: Diagnosis not present

## 2018-07-13 LAB — POCT RAPID STREP A (OFFICE): Rapid Strep A Screen: NEGATIVE

## 2018-07-13 NOTE — Assessment & Plan Note (Signed)
Encouraged smoking cessation 

## 2018-07-13 NOTE — Patient Instructions (Addendum)
I do encourage you to quit smoking Call [phone] to sign up for smoking cessation classes You can call 1-800-QUIT-NOW to talk with a smoking cessation coach   Health Risks of Smoking Smoking cigarettes is very bad for your health. Tobacco smoke has over 200 known poisons in it. It contains the poisonous gases nitrogen oxide and carbon monoxide. There are over 60 chemicals in tobacco smoke that cause cancer. Smoking is difficult to quit because a chemical in tobacco, called nicotine, causes addiction or dependence. When you smoke and inhale, nicotine is absorbed rapidly into the bloodstream through your lungs. Both inhaled and non-inhaled nicotine may be addictive. What are the risks of cigarette smoke? Cigarette smokers have an increased risk of many serious medical problems, including:  Lung cancer.  Lung disease, such as pneumonia, bronchitis, and emphysema.  Chest pain (angina) and heart attack because the heart is not getting enough oxygen.  Heart disease and peripheral blood vessel disease.  High blood pressure (hypertension).  Stroke.  Oral cancer, including cancer of the lip, mouth, or voice box.  Bladder cancer.  Pancreatic cancer.  Cervical cancer.  Pregnancy complications, including premature birth.  Stillbirths and smaller newborn babies, birth defects, and genetic damage to sperm.  Early menopause.  Lower estrogen level for women.  Infertility.  Facial wrinkles.  Blindness.  Increased risk of broken bones (fractures).  Senile dementia.  Stomach ulcers and internal bleeding.  Delayed wound healing and increased risk of complications during surgery.  Even smoking lightly shortens your life expectancy by several years.  Because of secondhand smoke exposure, children of smokers have an increased risk of the following:  Sudden infant death syndrome (SIDS).  Respiratory infections.  Lung cancer.  Heart disease.  Ear infections.  What are  the benefits of quitting? There are many health benefits of quitting smoking. Here are some of them:  Within days of quitting smoking, your risk of having a heart attack decreases, your blood flow improves, and your lung capacity improves. Blood pressure, pulse rate, and breathing patterns start returning to normal soon after quitting.  Within months, your lungs may clear up completely.  Quitting for 10 years reduces your risk of developing lung cancer and heart disease to almost that of a nonsmoker.  People who quit may see an improvement in their overall quality of life.  How do I quit smoking? Smoking is an addiction with both physical and psychological effects, and longtime habits can be hard to change. Your health care provider can recommend:  Programs and community resources, which may include group support, education, or talk therapy.  Prescription medicines to help reduce cravings.  Nicotine replacement products, such as patches, gum, and nasal sprays. Use these products only as directed. Do not replace cigarette smoking with electronic cigarettes, which are commonly called e-cigarettes. The safety of e-cigarettes is not known, and some may contain harmful chemicals.  A combination of two or more of these methods.  Where to find more information:  American Lung Association: www.lung.org  American Cancer Society: www.cancer.org Summary  Smoking cigarettes is very bad for your health. Cigarette smokers have an increased risk of many serious medical problems, including several cancers, heart disease, and stroke.  Smoking is an addiction with both physical and psychological effects, and longtime habits can be hard to change.  By stopping right away, you can greatly reduce the risk of medical problems for you and your family.  To help you quit smoking, your health care provider can recommend programs,   community resources, prescription medicines, and nicotine replacement products  such as patches, gum, and nasal sprays. This information is not intended to replace advice given to you by your health care provider. Make sure you discuss any questions you have with your health care provider. Document Released: 12/16/2004 Document Revised: 11/12/2016 Document Reviewed: 11/12/2016 Elsevier Interactive Patient Education  2017 [employer]  Steps to Quit Smoking Smoking tobacco can be bad for your health. It can also affect almost every organ in your body. Smoking puts you and people around you at risk for many serious long-lasting (chronic) diseases. Quitting smoking is hard, but it is one of the best things that you can do for your health. It is never too late to quit. What are the benefits of quitting smoking? When you quit smoking, you lower your risk for getting serious diseases and conditions. They can include:  Lung cancer or lung disease.  Heart disease.  Stroke.  Heart attack.  Not being able to have children (infertility).  Weak bones (osteoporosis) and broken bones (fractures).  If you have coughing, wheezing, and shortness of breath, those symptoms may get better when you quit. You may also get sick less often. If you are pregnant, quitting smoking can help to lower your chances of having a baby of low birth weight. What can I do to help me quit smoking? Talk with your doctor about what can help you quit smoking. Some things you can do (strategies) include:  Quitting smoking totally, instead of slowly cutting back how much you smoke over a period of time.  Going to in-person counseling. You are more likely to quit if you go to many counseling sessions.  Using resources and support systems, such as: ? Agricultural engineernline chats with a Veterinary surgeoncounselor. ? Phone quitlines. ? Automotive engineerrinted self-help materials. ? Support groups or group counseling. ? Text messaging programs. ? Mobile phone apps or applications.  Taking medicines. Some of these medicines may have nicotine in them.  If you are pregnant or breastfeeding, do not take any medicines to quit smoking unless your doctor says it is okay. Talk with your doctor about counseling or other things that can help you.  Talk with your doctor about using more than one strategy at the same time, such as taking medicines while you are also going to in-person counseling. This can help make quitting easier. What things can I do to make it easier to quit? Quitting smoking might feel very hard at first, but there is a lot that you can do to make it easier. Take these steps:  Talk to your family and friends. Ask them to support and encourage you.  Call phone quitlines, reach out to support groups, or work with a Veterinary surgeoncounselor.  Ask people who smoke to not smoke around you.  Avoid places that make you want (trigger) to smoke, such as: ? Bars. ? Parties. ? Smoke-break areas at work.  Spend time with people who do not smoke.  Lower the stress in your life. Stress can make you want to smoke. Try these things to help your stress: ? Getting regular exercise. ? Deep-breathing exercises. ? Yoga. ? Meditating. ? Doing a body scan. To do this, close your eyes, focus on one area of your body at a time from head to toe, and notice which parts of your body are tense. Try to relax the muscles in those areas.  Download or buy apps on your mobile phone or tablet that can help you stick to your  quit plan. There are many free apps, such as QuitGuide from the [employer] developer(Centers for Disease Control and Prevention). You can find more support from smokefree.gov and other websites.  This information is not intended to replace advice given to you by your health care provider. Make sure you discuss any questions you have with your health care provider. Document Released: 09/04/2009 Document Revised: 07/06/2016 Document Reviewed: 03/25/2015 Elsevier Interactive Patient Education  2018 Elsevier Inc.  Upper Respiratory Infection, Adult Most upper  respiratory infections (URIs) are caused by a virus. A URI affects the nose, throat, and upper air passages. The most common type of URI is often called "the common cold." Follow these instructions at home:  Take medicines only as told by your doctor.  Gargle warm saltwater or take cough drops to comfort your throat as told by your doctor.  Use a warm mist humidifier or inhale steam from a shower to increase air moisture. This may make it easier to breathe.  Drink enough fluid to keep your pee (urine) clear or pale yellow.  Eat soups and other clear broths.  Have a healthy diet.  Rest as needed.  Go back to work when your fever is gone or your doctor says it is okay. ? You may need to stay home longer to avoid giving your URI to others. ? You can also wear a face mask and wash your hands often to prevent spread of the virus.  Use your inhaler more if you have asthma.  Do not use any tobacco products, including cigarettes, chewing tobacco, or electronic cigarettes. If you need help quitting, ask your doctor. Contact a doctor if:  You are getting worse, not better.  Your symptoms are not helped by medicine.  You have chills.  You are getting more short of breath.  You have brown or red mucus.  You have yellow or brown discharge from your nose.  You have pain in your face, especially when you bend forward.  You have a fever.  You have puffy (swollen) neck glands.  You have pain while swallowing.  You have white areas in the back of your throat. Get help right away if:  You have very bad or constant: ? Headache. ? Ear pain. ? Pain in your forehead, behind your eyes, and over your cheekbones (sinus pain). ? Chest pain.  You have long-lasting (chronic) lung disease and any of the following: ? Wheezing. ? Long-lasting cough. ? Coughing up blood. ? A change in your usual mucus.  You have a stiff neck.  You have changes in  your: ? Vision. ? Hearing. ? Thinking. ? Mood. This information is not intended to replace advice given to you by your health care provider. Make sure you discuss any questions you have with your health care provider. Document Released: 04/26/2008 Document Revised: 07/11/2016 Document Reviewed: 02/13/2014 Elsevier Interactive Patient Education  2018 [employer]

## 2018-07-13 NOTE — Progress Notes (Signed)
BP 112/62   Pulse 79   Temp 98.2 F (36.8 C)   Ht 4\' 11"  (1.499 m)   Wt 132 lb 3.2 oz (60 kg)   LMP 06/10/2018   SpO2 95%   BMI 26.70 kg/m    Subjective:    Patient ID: Veronica Herman, female    DOB: 1995-12-14, 22 y.o.   MRN: [id number]  HPI: Veronica Herman is a 22 y.o. female  Chief Complaint  Patient presents with  . URI    HPI Patient is here for an acute visit Complains of an upper respiratory infection Sick a few days ago; could feel it coming on First symptoms, kind of felt bad, nose started running No fever No travel No myalgias Throat is sore, every time she talks or swallow; that is the worst thing No swollen glands ulse has been fast for her Little bit ear clogging No medicines She smokes about 1/2 ppd, but would be interested in talking about quitting  Depression screen [facility] 2/9 07/13/2018 07/30/2016 03/09/2016 12/24/2015 10/07/2015  Decreased Interest 0 0 0 0 0  Down, Depressed, Hopeless 0 0 0 0 0  PHQ - 2 Score 0 0 0 0 0    Relevant past medical, surgical, family and social history reviewed Past Medical History:  Diagnosis Date  . Allergy    pollen  . Tobacco abuse 08/01/2016   Past Surgical History:  Procedure Laterality Date  . INDUCED ABORTION     almost 2 yrs ago  . INTRAUTERINE DEVICE INSERTION  2016   health dept in [facility]   Social History   Tobacco Use  . Smoking status: Current Every Day Smoker    Packs/day: 0.25    Types: Cigarettes  . Smokeless tobacco: Never Used  Substance Use Topics  . Alcohol use: No    Alcohol/week: 0.0 standard drinks  . Drug use: No    Interim medical history since last visit reviewed. Allergies and medications reviewed  Review of Systems Per HPI unless specifically indicated above     Objective:    BP 112/62   Pulse 79   Temp 98.2 F (36.8 C)   Ht 4\' 11"  (1.499 m)   Wt 132 lb 3.2 oz (60 kg)   LMP 06/10/2018   SpO2 95%   BMI 26.70 kg/m   Wt Readings from Last 3 Encounters:    07/13/18 132 lb 3.2 oz (60 kg)  04/27/18 135 lb (61.2 kg)  11/03/17 138 lb (62.6 kg)    Physical Exam  Constitutional: She appears well-developed and well-nourished. No distress.  HENT:  Herman Ear: Tympanic membrane and ear canal normal.  Left Ear: Tympanic membrane and ear canal normal.  Nose: No rhinorrhea.  Mouth/Throat: Mucous membranes are normal. Mucous membranes are not dry. Posterior oropharyngeal erythema present. No oropharyngeal exudate or posterior oropharyngeal edema. Tonsils are 1+ on the Herman. Tonsils are 1+ on the left.  Eyes: EOM are normal. No scleral icterus.  Cardiovascular: Normal rate and regular rhythm.  Pulmonary/Chest: Effort normal and breath sounds normal.  Skin: She is not diaphoretic. No pallor.  Psychiatric: She has a normal mood and affect. Her behavior is normal.    Results for orders placed or performed in visit on 07/13/18  POCT rapid strep A  Result Value Ref Range   Rapid Strep A Screen Negative Negative      Assessment & Plan:   Problem List Items Addressed This Visit      Other   Tobacco  abuse    Encouraged smoking cessation       Other Visit Diagnoses    Upper respiratory tract infection, unspecified type    -  Primary   most likely viral etiology; will have her treat symptomatically; out of work/school today and tomorrow   Relevant Orders   Culture, Group A Strep   POCT rapid strep A (Completed)   Pharyngitis, unspecified etiology       rapid strep negative, culture pending, but most likely viral etiology   Relevant Orders   Culture, Group A Strep   POCT rapid strep A (Completed)       Follow up plan: No follow-ups on file.  An after-visit summary was printed and given to the patient at check-out.  Please see the patient instructions which may contain other information and recommendations beyond what is mentioned above in the assessment and plan.  No orders of the defined types were placed in this encounter.   Orders  Placed This Encounter  Procedures  . Culture, Group A Strep  . POCT rapid strep A

## 2018-07-15 LAB — CULTURE, GROUP A STREP
MICRO NUMBER:: 91005322
SPECIMEN QUALITY:: ADEQUATE

## 2022-05-05 ENCOUNTER — Encounter: Payer: Self-pay | Admitting: Advanced Practice Midwife

## 2022-05-05 ENCOUNTER — Ambulatory Visit: Payer: BLUE CROSS/BLUE SHIELD

## 2022-05-05 ENCOUNTER — Ambulatory Visit (LOCAL_COMMUNITY_HEALTH_CENTER): Payer: Medicaid Other | Admitting: Advanced Practice Midwife

## 2022-05-05 VITALS — BP 121/84 | HR 87 | Temp 97.3°F | Ht 59.0 in | Wt 154.6 lb

## 2022-05-05 DIAGNOSIS — E669 Obesity, unspecified: Secondary | ICD-10-CM

## 2022-05-05 DIAGNOSIS — Z30433 Encounter for removal and reinsertion of intrauterine contraceptive device: Secondary | ICD-10-CM

## 2022-05-05 DIAGNOSIS — R6252 Short stature (child): Secondary | ICD-10-CM

## 2022-05-05 DIAGNOSIS — Z3009 Encounter for other general counseling and advice on contraception: Secondary | ICD-10-CM

## 2022-05-05 DIAGNOSIS — Z309 Encounter for contraceptive management, unspecified: Secondary | ICD-10-CM

## 2022-05-05 DIAGNOSIS — F17200 Nicotine dependence, unspecified, uncomplicated: Secondary | ICD-10-CM

## 2022-05-05 LAB — HEMOGLOBIN, FINGERSTICK: Hemoglobin: 13.9 g/dL (ref 11.1–15.9)

## 2022-05-05 LAB — HM HIV SCREENING LAB: HM HIV Screening: NEGATIVE

## 2022-05-05 MED ORDER — LEVONORGESTREL 20.1 MCG/DAY IU IUD
1.0000 | INTRAUTERINE_SYSTEM | Freq: Once | INTRAUTERINE | Status: AC
  Administered 2022-05-05: 1 via INTRAUTERINE

## 2022-05-05 NOTE — Progress Notes (Signed)
Hgb = 13.9 and no intervention needed per standing order. Jossie Ng, RN Wet prep reviewed - negative. Jossie Ng, RN

## 2022-05-05 NOTE — Progress Notes (Signed)
[facility] Main Number: [phone]    Family Planning Visit- Initial Visit  Subjective:  Veronica Herman is a 26 y.o. SWF smoker G1P0010   being seen today for an initial annual visit and to discuss reproductive life planning.  The patient is currently using IUD or IUS for pregnancy prevention. Patient reports   does not want a pregnancy in the next year.     report they are looking for a method that provides High efficacy at preventing pregnancy  Patient has the following medical conditions has IUD check up; Atypical eating disorder; Tobacco abuse; and Obesity BMI=31.2 on their problem list.  Chief Complaint  Patient presents with   Annual Exam    Patient reports here for physical, IUD removal and reinsertion. LMP 01/2022. Kyleena inserted 05/20/17 by Gwynneth Macleod, CNM. Last sex 04/14/22 without condom; with current partner x 4 years; 1 partner in last 3 mo. Last cig today. Last MJ 02/2022. Last ETOH 04/24/22 (1 wine cooler) "rare". Can't remember last pap? Maybe [place] [facility]? Last PE 05/09/17.  Last dental exam age 47. Working 40 hrs/wk, not in school. Living with her partner  Patient denies vaping, cigars  Body mass index is 31.23 kg/m. - Patient is eligible for diabetes screening based on BMI and age [id number]?  not applicable [id number] ordered? not applicable  Patient reports 1  partner/s in last year. Desires STI screening?  Yes  Has patient been screened once for HCV in the past?  No  No results found for: "HCVAB"  Does the patient have current drug use (including MJ), have a partner with drug use, and/or has been incarcerated since last result? Yes  If yes-- Screen for HCV through [facility]   Does the patient meet criteria for HBV testing? No  Criteria:  -Household, sexual or needle sharing contact with HBV -History of drug use -HIV positive -Those with known Hep C   Health Maintenance Due   Topic Date Due   COVID-19 Vaccine (1) Never done   HIV Screening  Never done   HPV VACCINES (2 - 3-dose series) 10/17/2013   Hepatitis C Screening  Never done   PAP-Cervical Cytology Screening  Never done   PAP SMEAR-Modifier  Never done   TETANUS/TDAP  12/05/2018    Review of Systems  All other systems reviewed and are negative.   The following portions of the patient's history were reviewed and updated as appropriate: allergies, current medications, past family history, past medical history, past social history, past surgical history and problem list. Problem list updated.   See flowsheet for other program required questions.  Objective:   Vitals:   05/05/22 1543  BP: 121/84  Pulse: 87  Temp: (!) 97.3 F (36.3 C)  Weight: 154 lb 9.6 oz (70.1 kg)  Height: 4\' 11"  (1.499 m)    Physical Exam Constitutional:      Appearance: Normal appearance. She is obese.  HENT:     Head: Normocephalic and atraumatic.     Mouth/Throat:     Mouth: Mucous membranes are moist.     Comments: Last dental exam age 34 Eyes:     Conjunctiva/sclera: Conjunctivae normal.  Neck:     Thyroid: No thyroid mass, thyromegaly or thyroid tenderness.  Cardiovascular:     Rate and Rhythm: Normal rate and regular rhythm.  Pulmonary:     Effort: Pulmonary effort is normal.     Breath sounds:  Normal breath sounds.  Chest:  Breasts:    Right: Normal.     Left: Normal.  Abdominal:     Palpations: Abdomen is soft.     Comments: Soft wtihout masses or tenderness  Genitourinary:    General: Normal vulva.     Exam position: Lithotomy position.     Vagina: Vaginal discharge (white creamy leukorrhea, ph<4.5) present.     Cervix: Erythema (erythema with postcoital spotting, pap done) present.     Uterus: Normal.      Adnexa: Right adnexa normal and left adnexa normal.     Rectum: Normal.  Musculoskeletal:        General: Normal range of motion.     Cervical back: Normal range of motion and neck  supple.  Skin:    General: Skin is warm and dry.  Neurological:     Mental Status: She is alert.  Psychiatric:        Mood and Affect: Mood normal.       Assessment and Plan:  Veronica Herman is a 26 y.o. female presenting to the [facility] Department for an initial annual wellness/contraceptive visit  Contraception counseling: Reviewed options based on patient desire and reproductive life plan. Patient is interested in IUD or IUS. This was provided to the patient today.  if not why not clearly documented  Risks, benefits, and typical effectiveness rates were reviewed.  Questions were answered.  Written information was also given to the patient to review.    The patient will follow up in  prn     for surveillance.  The patient was told to call with any further questions, or with any concerns about this method of contraception.  Emphasized use of condoms 100% of the time for STI prevention.  Need for ECP was assessed. Patient reported not meeting criteria.  Reviewed options and patient desired LNG -IUD   1. Family planning Treat wet mount per standing orders Immunization nurse consult Please give dental list to pt  - HIV [place] [facility] - Syphilis Serology, [place] [facility] - Hemoglobin, venipuncture - WET PREP FOR TRICH, YEAST, CLUE - Chlamydia/Gonorrhea [place] [facility] - IGP, rfx Aptima HPV ASCU  2. Obesity, unspecified classification, unspecified obesity type, unspecified whether serious comorbidity present   3. Encounter for removal and reinsertion of intrauterine contraceptive device (IUD)   IUD Removal  Patient identified, informed consent performed, consent signed.  Patient was in the dorsal lithotomy position, normal external genitalia was noted.  A speculum was placed in the patient's vagina, normal discharge was noted, no lesions. The cervix was visualized, no lesions, no abnormal discharge.  The strings of the IUD were grasped and pulled using ring  forceps. The IUD was removed in its entirety.   Patient tolerated the procedure well.    Patient will use Liletta for contraception.  Routine preventative health maintenance measures emphasized.     Patient presented to [facility] for IUD insertion. Her GC/CT screening was found to be up to date and using WHO criteria we can be reasonably certain she is not pregnant or a pregnancy test was obtained which was Urine pregnancy test  today was N/A.  See Flowsheet for IUD check list  IUD Insertion Procedure Note Patient identified, informed consent performed, consent signed.   Discussed risks of irregular bleeding, cramping, infection, malpositioning or misplacement of the IUD outside the uterus which may require further procedure such as laparoscopy. Time out was performed.    Speculum placed  in the vagina.  Cervix visualized.  Cleaned with Betadine x 2.  Cervix Grasped anteriorly with a single tooth tenaculum.  Uterus sounded to 7 cm.  IUD Liletta placed per manufacturer's recommendations.  Strings trimmed to 3 cm. Tenaculum was removed, good hemostasis noted.  Patient tolerated procedure well.   Patient was given post-procedure instructions- both agency handout and verbally by provider.  She was advised to have backup contraception for one week.  Patient was also asked to check IUD strings periodically or follow up in 4 weeks for IUD check.      Return if symptoms worsen or fail to improve.  No future appointments.  Herbie Saxon, CNM

## 2022-05-06 LAB — WET PREP FOR TRICH, YEAST, CLUE
Trichomonas Exam: NEGATIVE
Yeast Exam: NEGATIVE

## 2022-05-17 ENCOUNTER — Encounter: Payer: Self-pay | Admitting: Advanced Practice Midwife

## 2022-05-17 ENCOUNTER — Telehealth: Payer: Self-pay

## 2022-05-17 DIAGNOSIS — R87619 Unspecified abnormal cytological findings in specimens from cervix uteri: Secondary | ICD-10-CM | POA: Insufficient documentation

## 2022-05-17 LAB — IGP, RFX APTIMA HPV ASCU: PAP Smear Comment: 0

## 2022-05-17 LAB — HPV APTIMA: HPV Aptima: POSITIVE — AB

## 2022-06-30 NOTE — Progress Notes (Deleted)
    GYNECOLOGY OFFICE COLPOSCOPY PROCEDURE NOTE  26 y.o. G1P0010 here for colposcopy for ASCUS with POSITIVE high risk HPV pap smear on 05/05/2022. Discussed role for HPV in cervical dysplasia, need for surveillance.  Patient gave informed written consent, time out was performed.  Placed in lithotomy position. Cervix viewed with speculum and colposcope after application of acetic acid.   Colposcopy adequate? {yes/no:20286}  {Findings; colposcopy:728}; corresponding biopsies obtained.  ECC specimen obtained. All specimens were labeled and sent to pathology.  Chaperone was present during entire procedure.  Patient was given post procedure instructions.  Will follow up pathology and manage accordingly; patient will be contacted with results and recommendations.  Routine preventative health maintenance measures emphasized.    Hildred Laser, MD [facility]

## 2022-07-01 ENCOUNTER — Encounter: Payer: Self-pay | Admitting: Obstetrics and Gynecology

## 2022-07-01 ENCOUNTER — Ambulatory Visit (INDEPENDENT_AMBULATORY_CARE_PROVIDER_SITE_OTHER): Payer: Medicaid Other | Admitting: Obstetrics and Gynecology

## 2022-07-01 ENCOUNTER — Other Ambulatory Visit (HOSPITAL_COMMUNITY)
Admission: RE | Admit: 2022-07-01 | Discharge: 2022-07-01 | Disposition: A | Discharge status: Home / Self Care | Payer: BLUE CROSS/BLUE SHIELD | Source: Ambulatory Visit | Attending: Obstetrics and Gynecology | Admitting: Obstetrics and Gynecology

## 2022-07-01 VITALS — BP 118/81 | HR 73 | Resp 16 | Ht 59.0 in | Wt 159.7 lb

## 2022-07-01 DIAGNOSIS — R87810 Cervical high risk human papillomavirus (HPV) DNA test positive: Secondary | ICD-10-CM | POA: Insufficient documentation

## 2022-07-01 DIAGNOSIS — N870 Mild cervical dysplasia: Secondary | ICD-10-CM

## 2022-07-01 DIAGNOSIS — R87610 Atypical squamous cells of undetermined significance on cytologic smear of cervix (ASC-US): Secondary | ICD-10-CM | POA: Insufficient documentation

## 2022-07-01 NOTE — Progress Notes (Signed)
    GYNECOLOGY OFFICE COLPOSCOPY PROCEDURE NOTE  26 y.o. G1P0010 here for colposcopy for ASCUS with POSITIVE high risk HPV pap smear on 05/10/2022. Referred from [facility] Department.  Discussed role for HPV in cervical dysplasia, need for surveillance.  Patient gave informed written consent, time out was performed.  Placed in lithotomy position. Cervix viewed with speculum and colposcope after application of acetic acid.   Colposcopy adequate? Yes Findings: visible lesion(s) at 11-12 o'clock; corresponding biopsies obtained.  ECC specimen obtained. All specimens were labeled and sent to pathology.  Chaperone was present during entire procedure.  Patient was given post procedure instructions.  Will follow up pathology and manage accordingly; patient will be contacted with results and recommendations.  Routine preventative health maintenance measures emphasized.   Hildred Laser, MD [facility]

## 2022-07-01 NOTE — Progress Notes (Signed)
     GYNECOLOGY OFFICE COLPOSCOPY PROCEDURE NOTE  26 y.o. G1P0010 here for colposcopy for ASCUS with POSITIVE high risk HPV pap smear on 05/05/2022. Discussed role for HPV in cervical dysplasia, need for surveillance.  Patient gave informed written consent, time out was performed.  Placed in lithotomy position. Cervix viewed with speculum and colposcope after application of acetic acid.   Colposcopy adequate? Yes  visible lesion(s) at 11-12 o'clock; corresponding biopsies obtained.  ECC specimen obtained. All specimens were labeled and sent to pathology.  Chaperone was present during entire procedure.  Patient was given post procedure instructions.  Will follow up pathology and manage accordingly; patient will be contacted with results and recommendations.  Routine preventative health maintenance measures emphasized.   Hildred Laser, MD [facility]

## 2022-07-02 LAB — SURGICAL PATHOLOGY

## 2022-07-05 ENCOUNTER — Encounter: Payer: Self-pay | Admitting: Obstetrics and Gynecology

## 2022-07-07 NOTE — Progress Notes (Signed)
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  Colpo on 07-01-22 by Dr. Valentino Saxon at [facility].  Per not on surgical path by Dr. Valentino Saxon: Repeat PAP in 1 year (due 06-2023).  Arnetha Courser, CNM agrees with plan per staff message today 07-07-2022.  Hart Carwin, RN

## 2022-11-02 ENCOUNTER — Other Ambulatory Visit: Payer: Self-pay

## 2022-11-02 DIAGNOSIS — R0602 Shortness of breath: Secondary | ICD-10-CM | POA: Diagnosis present

## 2022-11-02 DIAGNOSIS — J101 Influenza due to other identified influenza virus with other respiratory manifestations: Secondary | ICD-10-CM | POA: Insufficient documentation

## 2022-11-02 DIAGNOSIS — Z1152 Encounter for screening for COVID-19: Secondary | ICD-10-CM | POA: Diagnosis not present

## 2022-11-02 NOTE — ED Triage Notes (Signed)
Pt states she "got a cold yesterday" and today has been having a dry cough and sensation of "shortness of breath". Pt able to speak in full sentences, appears in no acute distress, pt does smoke.

## 2022-11-03 ENCOUNTER — Emergency Department
Admission: EM | Admit: 2022-11-03 | Discharge: 2022-11-03 | Disposition: A | Discharge status: Home / Self Care | Payer: Medicaid Other | Attending: Emergency Medicine | Admitting: Emergency Medicine

## 2022-11-03 ENCOUNTER — Emergency Department: Payer: Medicaid Other

## 2022-11-03 DIAGNOSIS — J101 Influenza due to other identified influenza virus with other respiratory manifestations: Secondary | ICD-10-CM

## 2022-11-03 DIAGNOSIS — J029 Acute pharyngitis, unspecified: Secondary | ICD-10-CM

## 2022-11-03 LAB — RESP PANEL BY RT-PCR (RSV, FLU A&B, COVID)  RVPGX2
Influenza A by PCR: POSITIVE — AB
Influenza B by PCR: NEGATIVE
Resp Syncytial Virus by PCR: NEGATIVE
SARS Coronavirus 2 by RT PCR: NEGATIVE

## 2022-11-03 MED ORDER — OSELTAMIVIR PHOSPHATE 75 MG PO CAPS: 75.0000 mg | ORAL_CAPSULE | Freq: Two times a day (BID) | ORAL | 0 refills | Status: AC

## 2022-11-03 MED ORDER — MAGIC MOUTHWASH
10.0000 mL | Freq: Once | ORAL | Status: AC
  Administered 2022-11-03: 10 mL via ORAL
  Filled 2022-11-03: qty 10

## 2022-11-03 MED ORDER — MAGIC MOUTHWASH: ORAL | 0 refills | Status: AC

## 2022-11-03 MED ORDER — IBUPROFEN 600 MG PO TABS
600.0000 mg | ORAL_TABLET | Freq: Once | ORAL | Status: AC
  Administered 2022-11-03: 600 mg via ORAL
  Filled 2022-11-03: qty 1

## 2022-11-03 NOTE — ED Provider Notes (Signed)
[facility] Provider Note    Event Date/Time   First MD Initiated Contact with Patient 11/03/22 0112     (approximate)   History   Shortness of Breath   HPI  Veronica Herman is a 26 y.o. female  who presents to the ED from home with a chief complaint of nonproductive cough, nasal congestion, body aches x 1 days. Denies fever/chills, chest pain, sob, abdominal pain, nausea, vomiting or dizziness.       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 Authorizing Provider  magic mouthwash SOLN 78mL Anbesol 73mL Benadryl 24mL Mylanta  57mL swish, gargle & spit q8hr prn throat discomfort 11/03/22  Yes Irean Hong, MD  oseltamivir (TAMIFLU) 75 MG capsule Take 1 capsule (75 mg total) by mouth 2 (two) times daily. 11/03/22  Yes Irean Hong, MD  ibuprofen (ADVIL) 200 MG tablet Take 400 mg by mouth every 6 (six) hours as needed.    [provider]     Allergies  Patient has no known allergies.   Family History   Family History  Problem Relation Age of Onset   Heart murmur Mother    Hyperlipidemia Mother    Hypertension Mother    Allergic Disorder Father    Hyperlipidemia Father    Hypertension Father    Drug abuse Maternal Aunt    Hyperlipidemia Maternal Aunt    Hypertension Maternal  Aunt    Mental illness Maternal Aunt    Drug abuse Maternal Uncle    Hypertension Paternal Uncle    Arthritis Maternal Grandmother    Asthma Maternal Grandmother    Cancer Maternal Grandmother    Depression Maternal Grandmother    Diabetes Maternal Grandmother    Hyperlipidemia Maternal Grandmother    Hypertension Maternal Grandmother    Mental illness Maternal Grandmother    Alcohol abuse Maternal Grandfather    Arthritis Maternal Grandfather    Asthma Maternal Grandfather    Cancer Maternal Grandfather    Diabetes Maternal Grandfather    Hypertension Maternal Grandfather    Arthritis Paternal Grandmother    Asthma Paternal Grandmother    Cancer Paternal Grandmother    Diabetes Paternal Grandmother    Hyperlipidemia Paternal Grandmother    Hypertension Paternal Grandmother    Alcohol abuse Paternal Grandfather    Arthritis Paternal Grandfather    Asthma Paternal Grandfather    Cancer Paternal Grandfather    Diabetes Paternal Grandfather    Hypertension Paternal Grandfather      Physical Exam  Triage Vital Signs: ED Triage Vitals [11/02/22 2349]  Enc Vitals Group     BP (!) 126/90     Pulse Rate 90     Resp 16     Temp  98.6 F (37 C)     Temp Source Oral     SpO2 100 %     Weight 140 lb (63.5 kg)     Height 4\' 11"  (1.499 m)     Head Circumference      Peak Flow      Pain Score 0     Pain Loc      Pain Edu?      Excl. in GC?     Updated Vital Signs: BP 122/78 (BP Location: Right Arm)   Pulse 85   Temp 98.6 F (37 C) (Oral)   Resp 18   Ht 4\' 11"  (1.499 m)   Wt 63.5 kg   LMP  (LMP Unknown)   SpO2 98%   BMI 28.28 kg/m    General: Awake, no distress.  CV:  RRR. Good peripheral perfusion.  Resp:  Normal effort. CTAB. Abd:  Nontender. No distention.  Other:  Nasal congestion noted. Supple neck without meningismus. No petechiae.   ED Results / Procedures / Treatments  Labs (all labs ordered are listed, but only abnormal results are displayed) Labs  Reviewed  RESP PANEL BY RT-PCR (RSV, FLU A&B, COVID)  [id number] - Abnormal; Notable for the following components:      Result Value   Influenza A by PCR POSITIVE (*)    All other components within normal limits     EKG  None   RADIOLOGY I have independently visualized and interpreted patient's xray as well as noted the radiology interpretation:  Cxr: Minimal bronchial thickening  Official radiology report(s): DG Chest 2 View  Result Date: 11/03/2022 CLINICAL DATA:  Cough and shortness of breath. EXAM: CHEST - 2 VIEW COMPARISON:  None Available. FINDINGS: The cardiomediastinal contours are normal. Minimal central bronchial thickening. Pulmonary vasculature is normal. No consolidation, pleural effusion, or pneumothorax. No acute osseous abnormalities are seen. IMPRESSION: Minimal central bronchial thickening suggesting bronchitis or asthma. Electronically Signed   By: M.D.   On: 11/03/2022 00:46     PROCEDURES:  Critical Care performed: No  Procedures   MEDICATIONS ORDERED IN ED: Medications  magic mouthwash (10 mLs Oral Given 11/03/22 0128)  ibuprofen (ADVIL) tablet 600 mg (600 mg Oral Given 11/03/22 0127)     IMPRESSION / MDM / ASSESSMENT AND PLAN / ED COURSE  I reviewed the triage vital signs and the nursing notes.                             27 year old female presenting with cold symptoms. Differential diagnosis includes but is not limited to viral process, CAP, bronchitis, etc. I have personally reviewed patient's records and note GYN procedure visit on 07/01/2022.  Patient's presentation is most consistent with acute, uncomplicated illness.  Patient is Flu A+, negative cxr for pneumonia. Will prescribed Tamiflu, Magic Mouthwash to use as needed, and patient will follow up with her PCP as needed. Strict return precautions given. Patient verbalizes understanding and agrees with plan of care.      FINAL CLINICAL IMPRESSION(S) / ED DIAGNOSES   Final  diagnoses:  Influenza A  Sore throat     Rx / DC Orders   ED Discharge Orders          Ordered    oseltamivir (TAMIFLU) 75 MG capsule  2 times daily        11/03/22 0118    magic mouthwash SOLN  11/03/22 0118             Note:  This document was prepared using Dragon voice recognition software and may include unintentional dictation errors.   Irean Hong, MD 11/03/22 [id number]

## 2022-11-03 NOTE — Discharge Instructions (Signed)
Start Tamiflu in the next 2 days.  You may take Ibuprofen and Tylenol as needed for fever or aches and pains.  You may take Magic mouthwash as needed for sore throat.  Return to the ER for worsening symptoms, persistent vomiting, difficulty breathing or other concerns.

## 2022-11-03 NOTE — ED Notes (Signed)
E-signature pad unavailable - Pt verbalized understanding of D/C information - no additional concerns at this time.
